# Patient Record
Sex: Female | Born: 1990 | Race: Black or African American | Hispanic: No | Marital: Single | Smoking: Never smoker
Health system: Southern US, Community
[De-identification: ages and names within clinical notes are randomized; demographics above are authoritative.]

---

## 2013-06-14 ENCOUNTER — Other Ambulatory Visit (HOSPITAL_COMMUNITY)
Admission: RE | Admit: 2013-06-14 | Discharge: 2013-06-14 | Disposition: A | Discharge status: Home / Self Care | Payer: Managed Care, Other (non HMO) | Source: Ambulatory Visit | Attending: Obstetrics and Gynecology | Admitting: Obstetrics and Gynecology

## 2013-06-14 DIAGNOSIS — Z113 Encounter for screening for infections with a predominantly sexual mode of transmission: Secondary | ICD-10-CM | POA: Insufficient documentation

## 2013-06-14 DIAGNOSIS — Z01419 Encounter for gynecological examination (general) (routine) without abnormal findings: Secondary | ICD-10-CM | POA: Insufficient documentation

## 2013-06-15 ENCOUNTER — Encounter (HOSPITAL_COMMUNITY): Payer: Self-pay | Admitting: Emergency Medicine

## 2013-06-15 ENCOUNTER — Emergency Department (INDEPENDENT_AMBULATORY_CARE_PROVIDER_SITE_OTHER): Payer: Managed Care, Other (non HMO)

## 2013-06-15 ENCOUNTER — Emergency Department (HOSPITAL_COMMUNITY)
Admission: EM | Admit: 2013-06-15 | Discharge: 2013-06-15 | Disposition: A | Discharge status: Home / Self Care | Payer: Managed Care, Other (non HMO) | Source: Home / Self Care | Attending: Family Medicine | Admitting: Family Medicine

## 2013-06-15 DIAGNOSIS — S335XXA Sprain of ligaments of lumbar spine, initial encounter: Secondary | ICD-10-CM

## 2013-06-15 DIAGNOSIS — S39012A Strain of muscle, fascia and tendon of lower back, initial encounter: Secondary | ICD-10-CM

## 2013-06-15 MED ORDER — CYCLOBENZAPRINE HCL 5 MG PO TABS: 5.0000 mg | ORAL_TABLET | Freq: Three times a day (TID) | ORAL | Status: AC | PRN

## 2013-06-15 MED ORDER — IBUPROFEN 800 MG PO TABS: 800.0000 mg | ORAL_TABLET | Freq: Three times a day (TID) | ORAL | Status: AC

## 2013-06-15 NOTE — ED Notes (Signed)
C/o lower back pain states she fell last Friday playing volleyball States she fell on her right side Denies any vomiting, nausea, and urinary problems

## 2013-06-15 NOTE — ED Provider Notes (Signed)
CSN: [id number]     Arrival date & time 06/15/13  1415 History   First MD Initiated Contact with Patient 06/15/13 1500     Chief Complaint  Patient presents with  . Back Pain   (Consider location/radiation/quality/duration/timing/severity/associated sxs/prior Treatment) Patient is a 23 y.o. female presenting with back pain. The history is provided by the patient.  Back Pain Location:  Lumbar spine Quality:  Shooting Radiates to:  Does not radiate Pain severity:  Mild Onset quality:  Gradual Progression:  Unchanged Chronicity:  New Context: falling   Relieved by:  None tried Worsened by:  Nothing tried Associated symptoms: no abdominal pain, no chest pain, no dysuria, no fever, no leg pain, no numbness and no weakness     History reviewed. No pertinent past medical history. No past surgical history on file. No family history on file. History  Substance Use Topics  . Smoking status: Not on file  . Smokeless tobacco: Not on file  . Alcohol Use: Not on file   OB History   Grav Para Term Preterm Abortions TAB SAB Ect Mult Living                 Review of Systems  Constitutional: Negative.  Negative for fever.  Cardiovascular: Negative for chest pain.  Gastrointestinal: Negative.  Negative for abdominal pain.  Genitourinary: Negative.  Negative for dysuria.  Musculoskeletal: Positive for back pain. Negative for gait problem and joint swelling.  Skin: Negative.   Neurological: Negative for weakness and numbness.    Allergies  Review of patient's allergies indicates no known allergies.  Home Medications   Prior to Admission medications   Not on File   BP 120/73  Pulse 83  Temp(Src) 97.6 F (36.4 C) (Oral)  Resp 16  SpO2 98%  LMP 05/29/2013 Physical Exam  Nursing note and vitals reviewed. Constitutional: She is oriented to person, place, and time. She appears well-developed and well-nourished.  Abdominal: Soft. Bowel sounds are normal.  Musculoskeletal: She  exhibits tenderness.       Lumbar back: She exhibits tenderness and pain. She exhibits normal range of motion, no bony tenderness, no swelling, no deformity, no spasm and normal pulse.       Back:  Neurological: She is alert and oriented to person, place, and time.  Skin: Skin is warm and dry.    ED Course  Procedures (including critical care time) Labs Review Labs Reviewed - No data to display  Imaging Review Dg Lumbar Spine Complete  06/15/2013   CLINICAL DATA:  Recent onset of low back pain, greater on the right.  EXAM: LUMBAR SPINE - COMPLETE 4+ VIEW  COMPARISON:  None.  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  There is slight left convex curvature of the lumbar spine. No pars defects are identified. There is no listhesis. No significant degenerative changes are identified. Vertebral body heights are preserved without evidence of compression fracture.  IMPRESSION: 1. Transitional lumbosacral anatomy. 2. No evidence of acute osseous abnormality. 3. Slight left convex curvature of the lumbar spine.   Electronically Signed   By: Sebastian AcheAllen  Grady   On: 06/15/2013 15:59    X-rays reviewed and report per radiologist.  MDM   1. Strain of lumbar paraspinal muscle        Linna HoffJames D Dlisa Barnwell, MD 06/15/13 1701

## 2014-04-24 ENCOUNTER — Emergency Department (HOSPITAL_COMMUNITY)
Admission: EM | Admit: 2014-04-24 | Discharge: 2014-04-24 | Disposition: A | Discharge status: Home / Self Care | Payer: Managed Care, Other (non HMO) | Source: Home / Self Care | Attending: Emergency Medicine | Admitting: Emergency Medicine

## 2014-04-24 ENCOUNTER — Encounter (HOSPITAL_COMMUNITY): Payer: Self-pay | Admitting: Emergency Medicine

## 2014-04-24 ENCOUNTER — Emergency Department (INDEPENDENT_AMBULATORY_CARE_PROVIDER_SITE_OTHER): Payer: Managed Care, Other (non HMO)

## 2014-04-24 DIAGNOSIS — J111 Influenza due to unidentified influenza virus with other respiratory manifestations: Secondary | ICD-10-CM

## 2014-04-24 DIAGNOSIS — R69 Illness, unspecified: Principal | ICD-10-CM

## 2014-04-24 LAB — POCT RAPID STREP A: STREPTOCOCCUS, GROUP A SCREEN (DIRECT): NEGATIVE

## 2014-04-24 MED ORDER — HYDROCOD POLST-CHLORPHEN POLST 10-8 MG/5ML PO LQCR: 5.0000 mL | Freq: Two times a day (BID) | ORAL | Status: AC | PRN

## 2014-04-24 MED ORDER — OSELTAMIVIR PHOSPHATE 75 MG PO CAPS: 75.0000 mg | ORAL_CAPSULE | Freq: Two times a day (BID) | ORAL | Status: AC

## 2014-04-24 NOTE — ED Provider Notes (Signed)
Chief Complaint   URI   History of Present Illness   Donna Perez is a 24 year old female who's had a two-day history of sweats, dizziness, headache, malaise, fatigue, nausea, rhinorrhea with clear drainage, sore throat, cough productive yellow white sputum, chills, temperature to 100.8. She's had no known sick exposures.  Review of Systems   Other than as noted above, the patient denies any of the following symptoms: Systemic:  No fevers, chills, sweats, or myalgias. Eye:  No redness or discharge. ENT:  No ear pain, headache, nasal congestion, drainage, sinus pressure, or sore throat. Neck:  No neck pain, stiffness, or swollen glands. Lungs:  No cough, sputum production, hemoptysis, wheezing, chest tightness, shortness of breath or chest pain. GI:  No abdominal pain, nausea, vomiting or diarrhea.  PMFSH   Past medical history, family history, social history, meds, and allergies were reviewed. She is allergic to penicillin.  Physical exam   Vital signs:  BP 113/66 mmHg  Pulse 107  Temp(Src) 100.8 F (38.2 C) (Oral)  Resp 18  SpO2 96%  LMP 04/23/2014 (Exact Date) General:  Alert and oriented.  In no distress.  Skin warm and dry. Eye:  No conjunctival injection or drainage. Lids were normal. ENT:  TMs and canals were normal, without erythema or inflammation.  Nasal mucosa was clear and uncongested, without drainage.  Mucous membranes were moist.  Pharynx was clear with no exudate or drainage.  There were no oral ulcerations or lesions. Neck:  Supple, no adenopathy, tenderness or mass. Lungs:  No respiratory distress.  Lungs were clear to auscultation, without wheezes, rales or rhonchi.  Breath sounds were clear and equal bilaterally.  Heart:  Regular rhythm, without gallops, murmers or rubs. Skin:  Clear, warm, and dry, without rash or lesions.  Labs   Results for orders placed or performed during the hospital encounter of 04/24/14  POCT rapid strep A [facility])  Result Value Ref Range   Streptococcus, Group A Screen (Direct) NEGATIVE NEGATIVE     Radiology   Dg Chest 2 View  04/24/2014   CLINICAL DATA:  Cough for 3 days  EXAM: CHEST  2 VIEW  COMPARISON:  None.  FINDINGS: The heart size and mediastinal contours are within normal limits. Both lungs are clear. The visualized skeletal structures are unremarkable.  IMPRESSION: No active cardiopulmonary disease.   Electronically Signed   By: Natasha MeadLiviu  Pop M.D.   On: 04/24/2014 11:29   Assessment     The encounter diagnosis was Influenza-like illness.  There is no evidence of pneumonia, strep throat, sinusitis, otitis media.    Plan    1.  Meds:  The following meds were prescribed:   Discharge Medication List as of 04/24/2014 11:43 AM    START taking these medications   Details  chlorpheniramine-HYDROcodone (TUSSIONEX) 10-8 MG/5ML LQCR Take 5 mLs by mouth every 12 (twelve) hours as needed for cough., Starting 04/24/2014, Until Discontinued, Normal    oseltamivir (TAMIFLU) 75 MG capsule Take 1 capsule (75 mg total) by mouth every 12 (twelve) hours., Starting 04/24/2014, Until Discontinued, Normal        2.  Patient Education/Counseling:  The patient was given appropriate handouts, self care instructions, and instructed in symptomatic relief.  Instructed to get extra fluids and extra rest.    3.  Follow up:  The patient was told to follow up here if no better in 3 to 4 days, or sooner if becoming worse in any way, and given some  red flag symptoms such as increasing fever, difficulty breathing, chest pain, or persistent vomiting which would prompt immediate return.       Reuben Likes, MD 04/24/14 [phone]

## 2014-04-24 NOTE — ED Notes (Signed)
Patient c/o cold sx including cough, congestion, chills and sweats x 2 days. Patient reports she has felt dizzy also. Patient is in NAD.

## 2014-04-24 NOTE — Discharge Instructions (Signed)
Flu is a viral illness caused by the influenza virus. Here are some hints about how to deal with it: ° °Get extra sleep and extra fluids.  Get 7 to 9 hours of sleep per night and 6 to 8 glasses of water a day.  Getting extra sleep keeps the immune system from getting run down.  Most people with an upper respiratory infection are a little dehydrated.  The extra fluids also keep the secretions liquified and easier to deal with.  Also, get extra vitamin C.  4000 mg per day is the recommended dose. °For the aches, headache, and fever, acetaminophen or ibuprofen are helpful.  These can be alternated every 4 hours.  People with liver disease should avoid large amounts of acetaminophen, and people with ulcer disease, gastroesophageal reflux, gastritis, congestive heart failure, chronic kidney disease, coronary artery disease and the elderly should avoid ibuprofen. People with flu should not take aspirin.  °For nasal congestion try Mucinex-D, or if you're having lots of sneezing or clear nasal drainage use Zyrtec-D. People with high blood pressure can take these if their blood pressure is controlled, if not, it's best to avoid the forms with a "D" (decongestants).  You can use the plain Mucinex, Allegra, Claritin, or Zyrtec even if your blood pressure is not controlled.   °A Saline nasal spray such as Ocean Spray can also help.  You can add a decongestant sprays such as Afrin, but you should not use the decongestant sprays for more than 3 or 4 days since they can be habituating.  Breathe Rite nasal strips can also offer a non-drug alternative treatment to nasal congestion, especially at night. °For people with symptoms of sinusitis, sleeping with your head elevated can be helpful.  For sinus pain, moist, hot compresses to the face may provide some relief.  Many people find that inhaling steam as in a shower or from a pot of steaming water can help. °For sore throat, throat lozenges are helpful.  Hot salt water gargles (8 oz  of hot water, 1/2 tsp of table salt, and a pinch of baking soda) can give relief as well as hot beverages such as hot tea.  Sucrets extra strength lozenges will help the sore throat.  °For the cough, take Delsym 2 tsp every 12 hours.  It has also been found recently that Aleve can help control a cough.  The dose is 1 to 2 tablets twice daily with food.  This can be combined with Delsym. (Note, if you are taking ibuprofen, you should not take Aleve as well--take one or the other.) °A cool mist vaporizer will help keep your mucous membranes from drying out.  °If you have been prescribed an antiviral medication, be sure to take it until it is completely finished.  ° °It's important when you have an upper respiratory infection not to pass the infection to others.  This involves being very careful about the following: ° °Frequent hand washing or use of hand sanitizer, especially after coughing, sneezing, blowing your nose or touching your face, nose or eyes. °Do not shake hands or touch anyone and try to avoid touching surfaces that other people use such as doorknobs, shopping carts, telephones and computer keyboards. Sanitize your work station, keyboard or phone with sanitizing wipes.  °Use tissues and dispose of them properly in a garbage can or ziplock bag. °Cough into your sleeve. °Do not let others eat or drink after you. °Wearing a mask is a good idea. ° °It's also   important to recognize the signs of serious illness and get evaluated if they occur: °Any respiratory infection that lasts more than 7 to 10 days.  Yellow nasal drainage and sputum are not reliable indicators of a bacterial infection, but if they last for more than 1 week, see your doctor. °Any kind of severe symptom such as difficulty breathing, persistent vomiting, or severe pain should prompt you to see a doctor as soon as possible. ° °

## 2014-04-26 LAB — CULTURE, GROUP A STREP: Strep A Culture: NEGATIVE

## 2014-04-26 NOTE — ED Notes (Signed)
Final report of strep lab negative; no further action required

## 2015-04-07 IMAGING — CR DG LUMBAR SPINE COMPLETE 4+V
5 series · 5 of 5 positions shown · non-contrast
Comparison: None.

CLINICAL DATA: Recent onset of low back pain, greater on the right.

EXAM:
LUMBAR SPINE - COMPLETE 4+ VIEW

[view not recorded (1 of 5)]
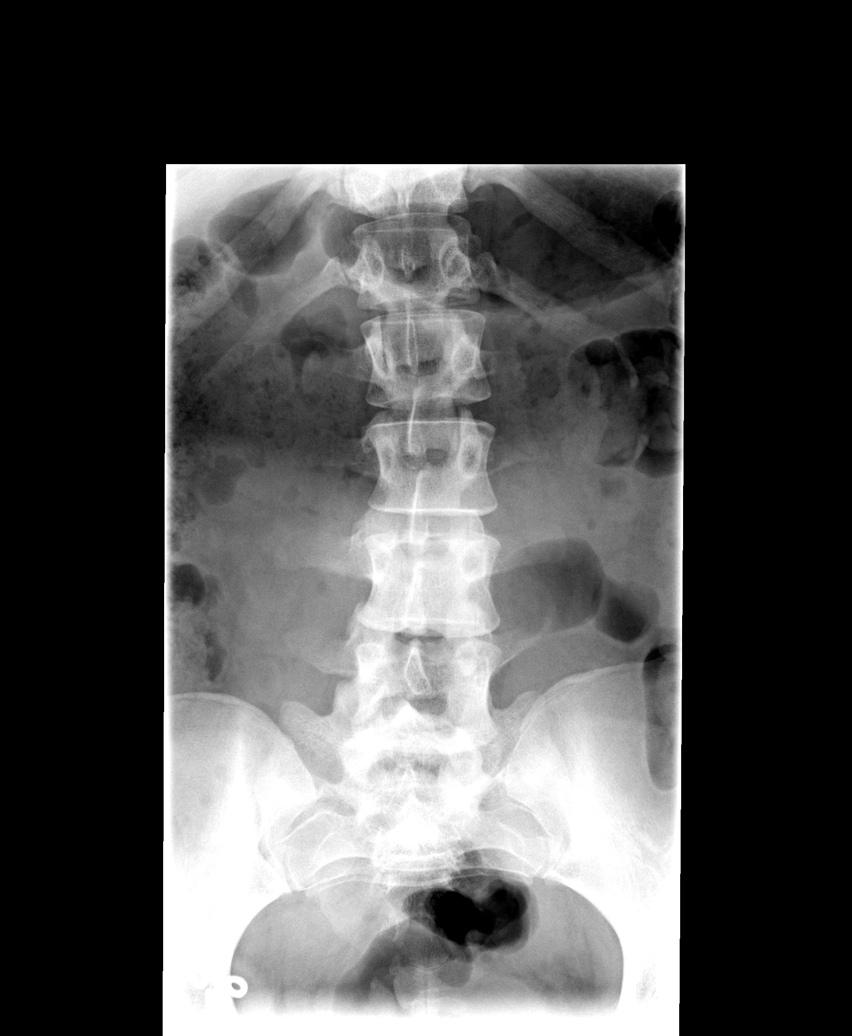

[view not recorded (2 of 5)]
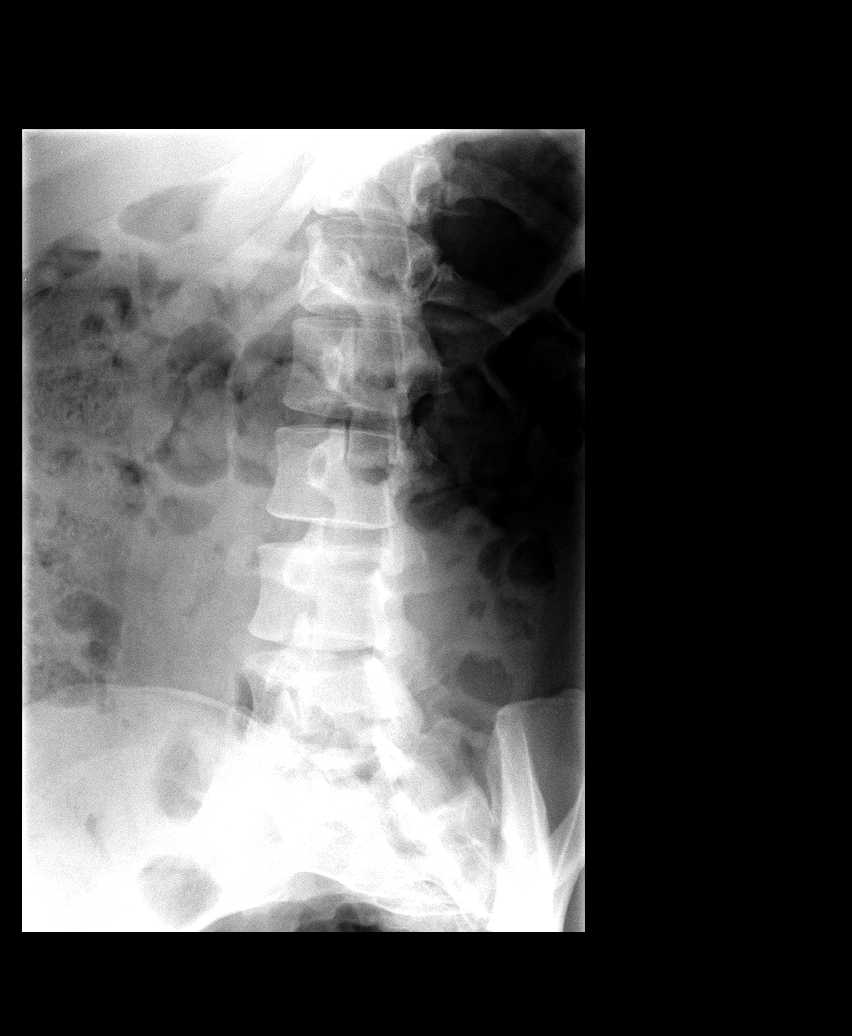

[view not recorded (3 of 5)]
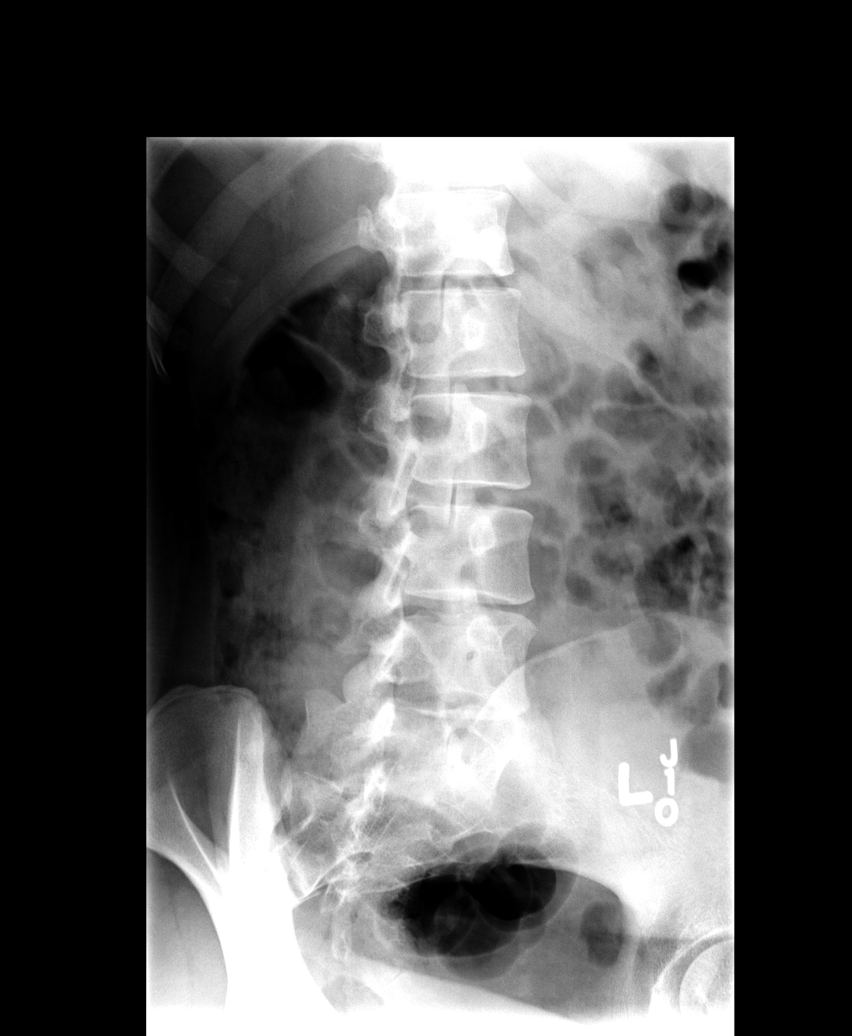

[view not recorded (4 of 5)]
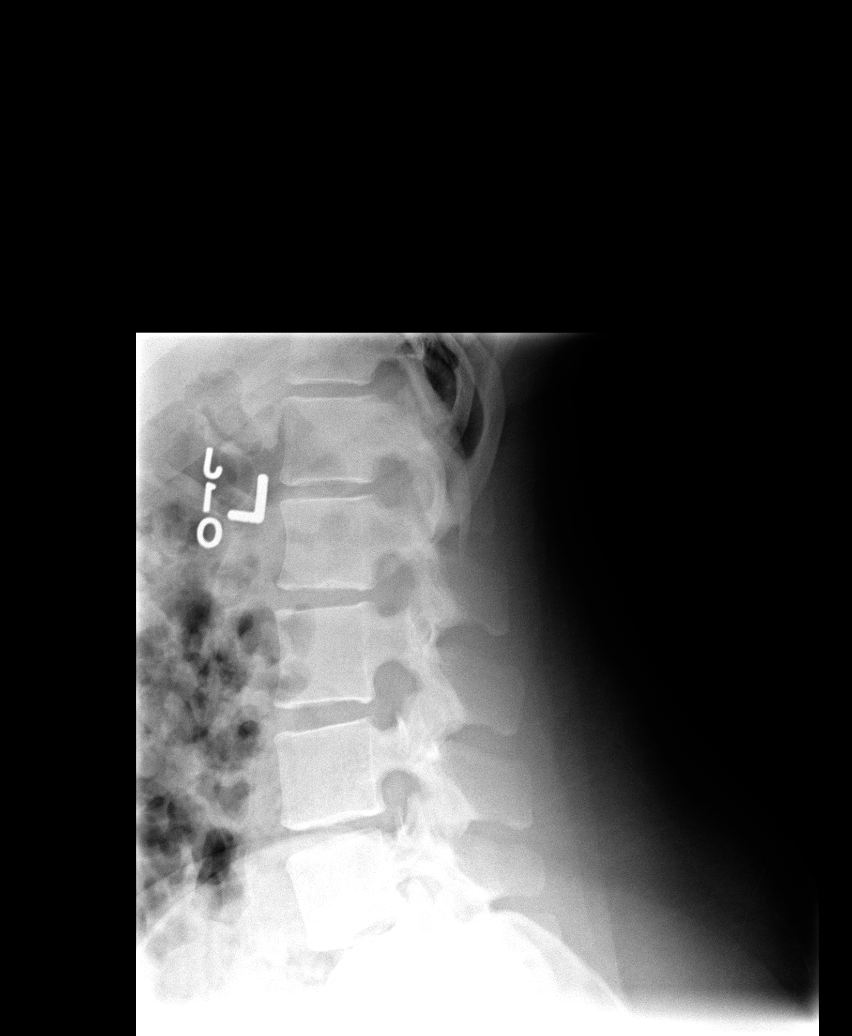

[view not recorded (5 of 5)]
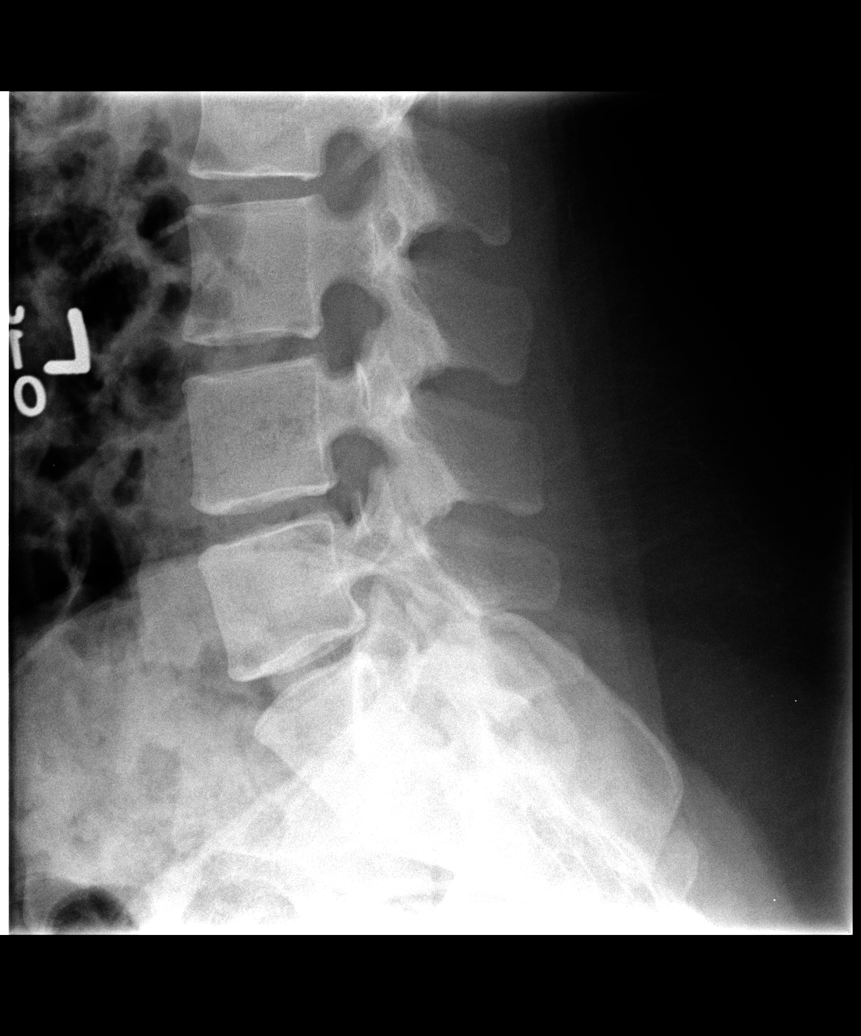

[5 of 5 positions shown; findings below may reference images not displayed]

FINDINGS: There is transitional lumbosacral anatomy with partial sacralization
of L5.

There is slight left convex curvature of the lumbar spine. No pars
defects are identified. There is no listhesis. No significant
degenerative changes are identified. Vertebral body heights are
preserved without evidence of compression fracture.
IMPRESSION: 1. Transitional lumbosacral anatomy.
2. No evidence of acute osseous abnormality.
3. Slight left convex curvature of the lumbar spine.

## 2016-02-14 IMAGING — DX DG CHEST 2V
2 series · 2 of 2 positions shown · non-contrast
Comparison: None.

CLINICAL DATA: Cough for 3 days

EXAM:
CHEST  2 VIEW

[chest pa]
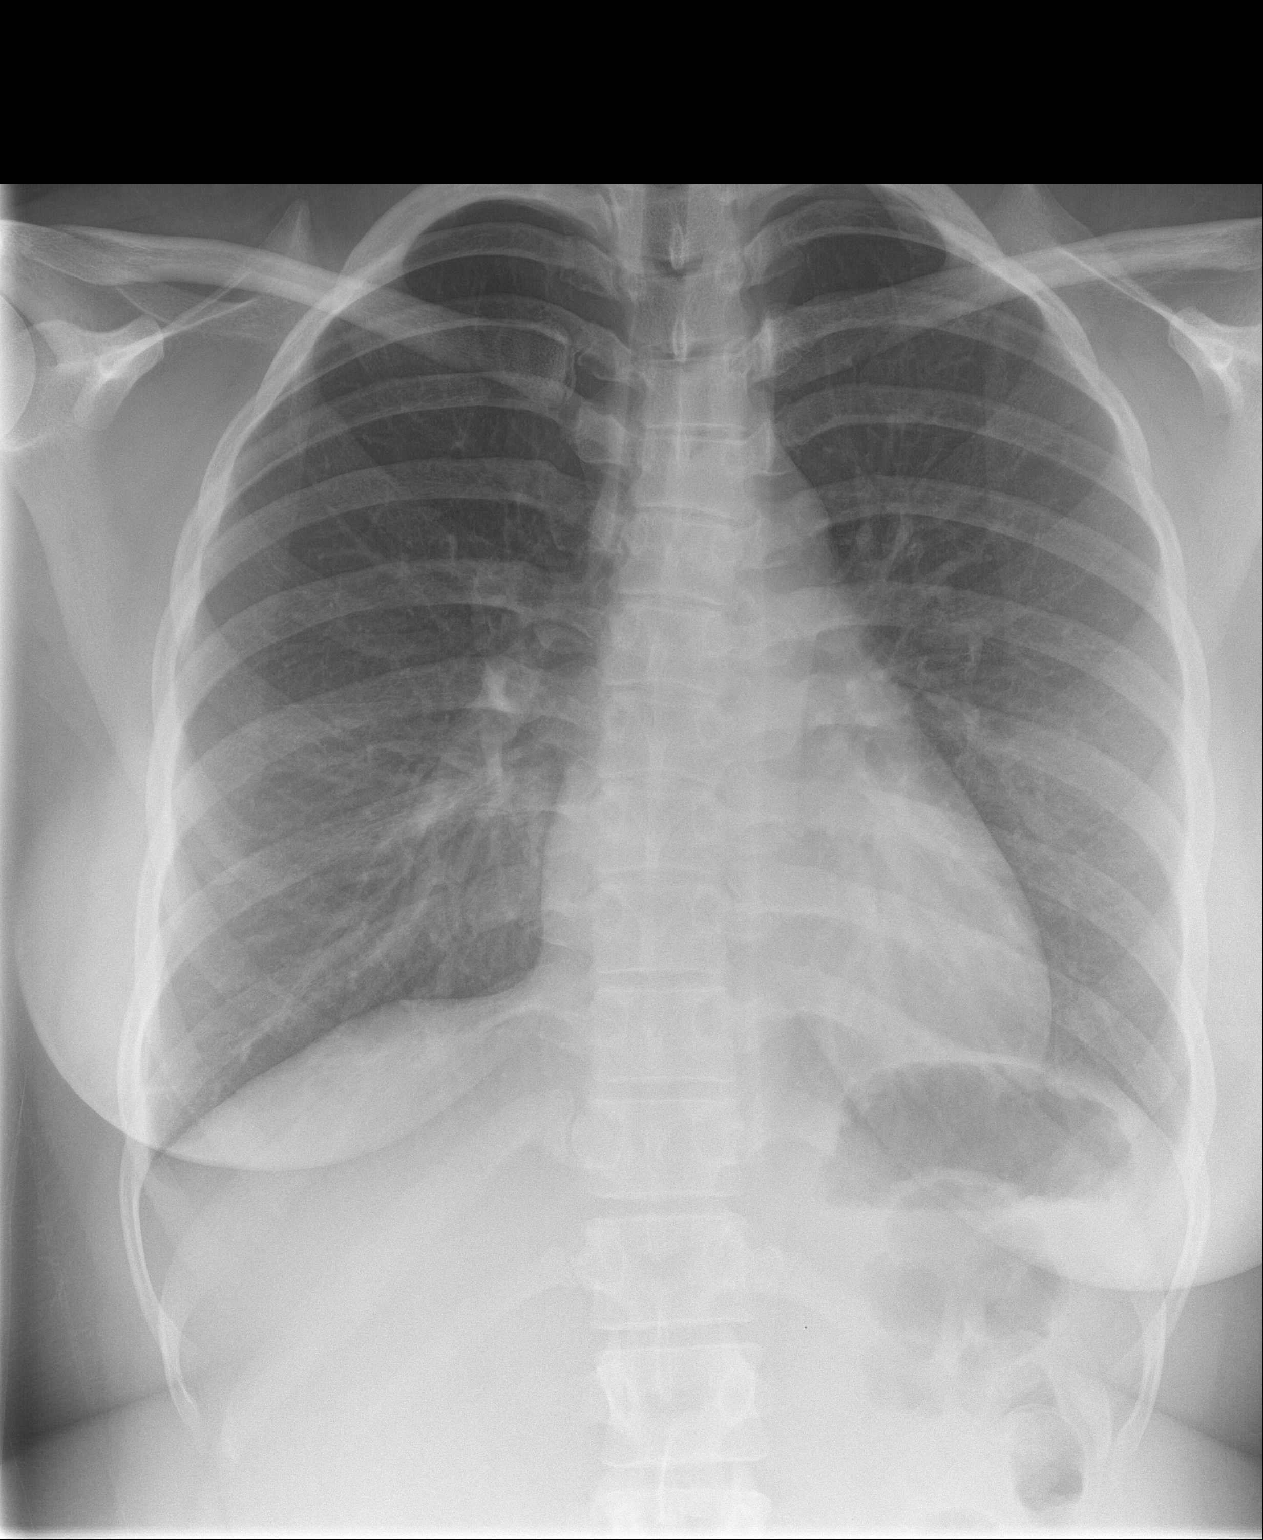

[chest lat]
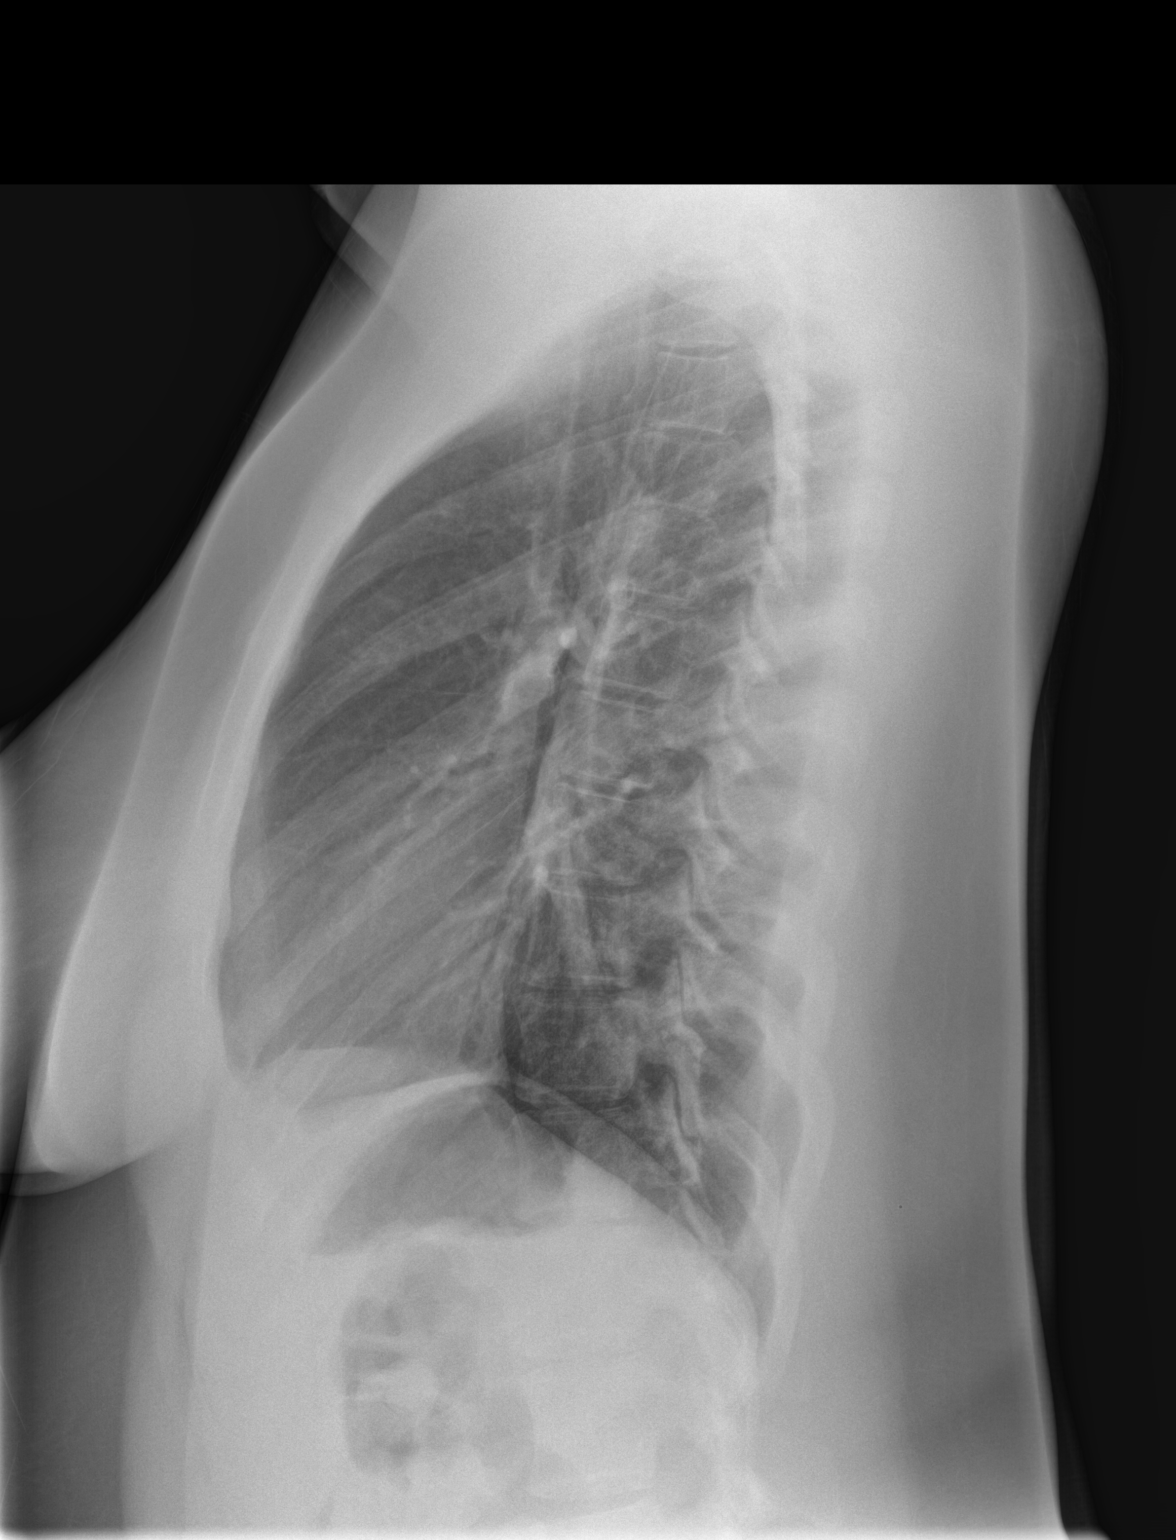

[2 of 2 positions shown; findings below may reference images not displayed]

FINDINGS: The heart size and mediastinal contours are within normal limits.
Both lungs are clear. The visualized skeletal structures are
unremarkable.
IMPRESSION: No active cardiopulmonary disease.
